# Patient Record
Sex: Male | Born: 1970 | Race: Black or African American | Hispanic: No | Marital: Married | Smoking: Never smoker
Health system: Southern US, Community
[De-identification: ages and names within clinical notes are randomized; demographics above are authoritative.]

## PROBLEM LIST (undated history)

## (undated) DIAGNOSIS — Q2112 Patent foramen ovale: Secondary | ICD-10-CM

## (undated) DIAGNOSIS — Q211 Atrial septal defect: Secondary | ICD-10-CM

## (undated) DIAGNOSIS — G43909 Migraine, unspecified, not intractable, without status migrainosus: Secondary | ICD-10-CM

## (undated) DIAGNOSIS — I480 Paroxysmal atrial fibrillation: Secondary | ICD-10-CM

## (undated) HISTORY — DX: Paroxysmal atrial fibrillation: I48.0

## (undated) HISTORY — DX: Atrial septal defect: Q21.1

## (undated) HISTORY — DX: Patent foramen ovale: Q21.12

---

## 2001-03-27 ENCOUNTER — Emergency Department (HOSPITAL_COMMUNITY): Admission: EM | Admit: 2001-03-27 | Discharge: 2001-03-27 | Payer: Self-pay | Admitting: Emergency Medicine

## 2001-03-27 ENCOUNTER — Encounter: Payer: Self-pay | Admitting: Emergency Medicine

## 2004-12-03 ENCOUNTER — Emergency Department (HOSPITAL_COMMUNITY): Admission: EM | Admit: 2004-12-03 | Discharge: 2004-12-03 | Payer: Self-pay | Admitting: Emergency Medicine

## 2005-08-28 ENCOUNTER — Ambulatory Visit: Payer: Self-pay | Admitting: Family Medicine

## 2005-09-15 ENCOUNTER — Ambulatory Visit: Payer: Self-pay | Admitting: Family Medicine

## 2006-06-28 DIAGNOSIS — G43009 Migraine without aura, not intractable, without status migrainosus: Secondary | ICD-10-CM | POA: Insufficient documentation

## 2006-06-28 DIAGNOSIS — I4949 Other premature depolarization: Secondary | ICD-10-CM

## 2011-07-04 ENCOUNTER — Ambulatory Visit (INDEPENDENT_AMBULATORY_CARE_PROVIDER_SITE_OTHER): Payer: Commercial Managed Care - PPO | Admitting: Family Medicine

## 2011-07-04 ENCOUNTER — Ambulatory Visit: Payer: Commercial Managed Care - PPO

## 2011-07-04 VITALS — BP 132/92 | HR 64 | Temp 97.9°F | Resp 16 | Ht 68.0 in | Wt 179.0 lb

## 2011-07-04 DIAGNOSIS — M25579 Pain in unspecified ankle and joints of unspecified foot: Secondary | ICD-10-CM

## 2011-07-04 DIAGNOSIS — S93429A Sprain of deltoid ligament of unspecified ankle, initial encounter: Secondary | ICD-10-CM

## 2011-07-04 NOTE — Progress Notes (Signed)
  Patient Name: Stephen Peterson Date of Birth: 1970-04-18 Medical Record Number: [id number] Gender: male Date of Encounter: 07/04/2011  History of Present Illness:  Stephen Peterson is a 41 y.o. very pleasant male patient who presents with the following:  Yesterday he was in a trash car pushing the trash down- he jumped out and landed on a flower bed edge and inverted his left ankle.  It hurt a lot right away.  He applied ice, ace wrap and used his wife's CAM boot and crutches.  He can now flex and extend the ankle, but cannot walk on it very well.   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  His job is fairly active and he is on his feet most of the time at work.    Patient Active Problem List  Diagnoses  . COMMON MIGRAINE  . PREMATURE VENTRICULAR CONTRACTIONS   No past medical history on file. No past surgical history on file. History  Substance Use Topics  . Smoking status: Never Smoker   . Smokeless tobacco: Not on file  . Alcohol Use: Not on file   No family history on file. No Known Allergies  Medication list has been reviewed and updated.  Review of Systems: As per HPI- otherwise negative. No history of prior ankle problems  Physical Examination: Filed Vitals:   07/04/11 1049  BP: 132/92  Pulse: 64  Temp: 97.9 F (36.6 C)  TempSrc: Oral  Resp: 16  Height: 5\' 8"  (1.727 m)  Weight: 179 lb (81.194 kg)    Body mass index is 27.22 kg/(m^2).   GEN: WDWN, NAD, Non-toxic, Alert & Oriented x 3 HEENT: Atraumatic, Normocephalic.  Ears and Nose: No external deformity. EXTR: No clubbing/cyanosis/edema NEURO: antalgic gait PSYCH: Normally interactive. Conversant. Not depressed or anxious appearing.  Calm demeanor.  Left foot and ankle:  No tenderness or swelling, achilles intact.  Patient feels discomfort with WB, not with exam  [facility] reading (PRIMARY) by  Dr. Patsy Lager. Negative foot and ankle   Assessment and Plan: 1. Pain, ankle  DG  Ankle Complete Right, DG Foot Complete Right  2. Sprain, ankle joint, medial     Ankle sprain- already a lot better.  Continue ace wrap and cam boot as needed.  Ice, elevate.  OOW for 2 days.  Patient (or parent if minor) instructed to return to clinic or call if not better in 2-3 day(s).

## 2013-10-29 ENCOUNTER — Encounter: Payer: Self-pay | Admitting: *Deleted

## 2019-11-29 ENCOUNTER — Other Ambulatory Visit: Payer: Self-pay

## 2019-11-29 ENCOUNTER — Emergency Department (HOSPITAL_BASED_OUTPATIENT_CLINIC_OR_DEPARTMENT_OTHER): Payer: PRIVATE HEALTH INSURANCE

## 2019-11-29 ENCOUNTER — Encounter (HOSPITAL_BASED_OUTPATIENT_CLINIC_OR_DEPARTMENT_OTHER): Payer: Self-pay | Admitting: Emergency Medicine

## 2019-11-29 ENCOUNTER — Emergency Department (HOSPITAL_BASED_OUTPATIENT_CLINIC_OR_DEPARTMENT_OTHER)
Admission: EM | Admit: 2019-11-29 | Discharge: 2019-11-29 | Disposition: A | Discharge status: Home / Self Care | Payer: PRIVATE HEALTH INSURANCE | Attending: Emergency Medicine | Admitting: Emergency Medicine

## 2019-11-29 DIAGNOSIS — Z7982 Long term (current) use of aspirin: Secondary | ICD-10-CM | POA: Insufficient documentation

## 2019-11-29 DIAGNOSIS — R112 Nausea with vomiting, unspecified: Secondary | ICD-10-CM | POA: Insufficient documentation

## 2019-11-29 DIAGNOSIS — R1031 Right lower quadrant pain: Secondary | ICD-10-CM | POA: Diagnosis not present

## 2019-11-29 DIAGNOSIS — M5459 Other low back pain: Secondary | ICD-10-CM | POA: Insufficient documentation

## 2019-11-29 HISTORY — DX: Migraine, unspecified, not intractable, without status migrainosus: G43.909

## 2019-11-29 LAB — URINALYSIS, MICROSCOPIC (REFLEX)

## 2019-11-29 LAB — COMPREHENSIVE METABOLIC PANEL
ALT: 20 U/L (ref 0–44)
AST: 17 U/L (ref 15–41)
Albumin: 4.3 g/dL (ref 3.5–5.0)
Alkaline Phosphatase: 53 U/L (ref 38–126)
Anion gap: 8 (ref 5–15)
BUN: 12 mg/dL (ref 6–20)
CO2: 27 mmol/L (ref 22–32)
Calcium: 9.5 mg/dL (ref 8.9–10.3)
Chloride: 102 mmol/L (ref 98–111)
Creatinine, Ser: 1.09 mg/dL (ref 0.61–1.24)
GFR, Estimated: >60 mL/min (ref >60)
Glucose, Bld: 95 mg/dL (ref 70–99)
Potassium: 4 mmol/L (ref 3.5–5.1)
Sodium: 137 mmol/L (ref 135–145)
Total Bilirubin: 0.9 mg/dL (ref 0.3–1.2)
Total Protein: 7.8 g/dL (ref 6.5–8.1)

## 2019-11-29 LAB — CBC
HCT: 45.3 % (ref 39.0–52.0)
Hemoglobin: 14.9 g/dL (ref 13.0–17.0)
MCH: 29 pg (ref 26.0–34.0)
MCHC: 32.9 g/dL (ref 30.0–36.0)
MCV: 88.3 fL (ref 80.0–100.0)
Platelets: 304 10*3/uL (ref 150–400)
RBC: 5.13 MIL/uL (ref 4.22–5.81)
RDW: 12.6 % (ref 11.5–15.5)
WBC: 6.2 10*3/uL (ref 4.0–10.5)
nRBC: 0 % (ref 0.0–0.2)

## 2019-11-29 LAB — URINALYSIS, ROUTINE W REFLEX MICROSCOPIC
Bilirubin Urine: NEGATIVE
Glucose, UA: NEGATIVE mg/dL
Ketones, ur: NEGATIVE mg/dL
Leukocytes,Ua: NEGATIVE
Nitrite: NEGATIVE
Protein, ur: NEGATIVE mg/dL
Specific Gravity, Urine: 1.025 (ref 1.005–1.030)
pH: 6 (ref 5.0–8.0)

## 2019-11-29 LAB — LIPASE, BLOOD: Lipase: 31 U/L (ref 11–51)

## 2019-11-29 MED ORDER — LACTATED RINGERS IV BOLUS
1000.0000 mL | Freq: Once | INTRAVENOUS | Status: AC
  Administered 2019-11-29: 1000 mL via INTRAVENOUS

## 2019-11-29 MED ORDER — ONDANSETRON 4 MG PO TBDP: 4.0000 mg | ORAL_TABLET | Freq: Three times a day (TID) | ORAL | 0 refills | Status: DC | PRN

## 2019-11-29 MED ORDER — SODIUM CHLORIDE 0.9 % IV BOLUS: 1000.0000 mL | Freq: Once | INTRAVENOUS | Status: DC

## 2019-11-29 MED ORDER — IOHEXOL 300 MG/ML  SOLN
100.0000 mL | Freq: Once | INTRAMUSCULAR | Status: AC | PRN
  Administered 2019-11-29: 100 mL via INTRAVENOUS

## 2019-11-29 MED ORDER — TAMSULOSIN HCL 0.4 MG PO CAPS: 0.4000 mg | ORAL_CAPSULE | Freq: Every day | ORAL | 0 refills | Status: AC

## 2019-11-29 NOTE — ED Notes (Signed)
Taken to CT.

## 2019-11-29 NOTE — Discharge Instructions (Signed)
   Hydration: Hydration is key to helping a kidney stone pass.  Have a goal of half a liter of water every hour or two. Antiinflammatory medications: Take 600 mg of ibuprofen every 6 hours or 440 mg (over the counter dose) to 500 mg (prescription dose) of naproxen every 12 hours for the next 3 days. After this time, these medications may be used as needed for pain. Take these medications with food to avoid upset stomach. Choose only one of these medications, do not take them together. Acetaminophen: Should you continue to have additional pain while taking the ibuprofen or naproxen, you may add in acetaminophen (generic for Tylenol) as needed. Your daily total maximum amount of acetaminophen from all sources should be limited to 4000mg /day for persons without liver problems, or 2000mg /day for those with liver problems. Tamsulosin: This medication is designed to help the stone pass.  Take this medication daily until stone passes. Nausea/vomiting: Use the ondansetron (generic for Zofran) for nausea or vomiting.  This medication may not prevent all vomiting or nausea, but can help facilitate better hydration. Things that can help with nausea/vomiting also include peppermint/menthol candies, vitamin B12, and ginger. Follow-up: Follow-up with the urologist as soon as possible on this matter. Return: Return to the ED for significantly increased pain, difficulty urinating, pain with urination, fever, uncontrolled vomiting, or any other major concerns.  For prescription assistance, may try using prescription discount sites or apps, such as goodrx.com or Good Rx smart phone app.

## 2019-11-29 NOTE — ED Triage Notes (Signed)
RLQ pain since yesterday with N/V 

## 2019-11-29 NOTE — ED Provider Notes (Signed)
[facility] EMERGENCY DEPARTMENT Provider Note   CSN: [id number] Arrival date & time: 11/29/19  1329     History Chief Complaint  Patient presents with  . Abdominal Pain    Stephen Peterson is a 49 y.o. male.  HPI      Stephen Peterson is a 49 y.o. male, with a history of A. fib, migraines, presenting to the ED with abdominal pain beginning around 1 AM this morning.  Pain in the right lower quadrant, described as a dull ache, initially more intense and radiating to the right flank and right lower back, waxing and waning.  Now only present in the right lower quadrant, 5/10.  Accompanied by initial nausea and vomiting, no current nausea.  Denies fever/chills, diarrhea, hematochezia/melena, dysuria, hematuria, groin pain, scrotal/testicular pain/swelling, dizziness, neurologic deficits, or any other complaints.  Past Medical History:  Diagnosis Date  . AF (paroxysmal atrial fibrillation) (HCC)   . Migraines   . Patent foramen ovale     Patient Active Problem List   Diagnosis Date Noted  . COMMON MIGRAINE 06/28/2006  . PREMATURE VENTRICULAR CONTRACTIONS 06/28/2006    History reviewed. No pertinent surgical history.     No family history on file.  Social History   Tobacco Use  . Smoking status: Never Smoker  . Smokeless tobacco: Never Used  Substance Use Topics  . Alcohol use: Yes  . Drug use: Never    Home Medications Prior to Admission medications   Medication Sig Start Date End Date Taking? Authorizing Provider  aspirin 81 MG tablet Take 81 mg by mouth daily.    [provider]  ondansetron (ZOFRAN ODT) 4 MG disintegrating tablet Take 1 tablet (4 mg total) by mouth every 8 (eight) hours as needed for nausea or vomiting. 11/29/19   Dylana Shaw C, PA-C  tamsulosin (FLOMAX) 0.4 MG CAPS capsule Take 1 capsule (0.4 mg total) by mouth daily. 11/29/19   Dwight Adamczak C, PA-C    Allergies    Patient has no known allergies.  Review of Systems   Review of  Systems  Constitutional: Negative for chills, diaphoresis and fever.  Respiratory: Negative for cough and shortness of breath.   Cardiovascular: Negative for chest pain.  Gastrointestinal: Positive for abdominal pain, nausea (resolved) and vomiting (resolved). Negative for blood in stool and diarrhea.  Genitourinary: Negative for dysuria, frequency, hematuria, scrotal swelling and testicular pain.  Musculoskeletal: Negative for back pain (None currently).  Neurological: Negative for weakness and numbness.  All other systems reviewed and are negative.   Physical Exam Updated Vital Signs BP (!) 157/95 (BP Location: Right Arm)   Pulse 65   Temp 99 F (37.2 C) (Oral)   Resp 16   Ht 5\' 8"  (1.727 m)   Wt 89.4 kg   SpO2 100%   BMI 29.95 kg/m   Physical Exam Vitals and nursing note reviewed.  Constitutional:      General: He is not in acute distress.    Appearance: He is well-developed. He is not diaphoretic.  HENT:     Head: Normocephalic and atraumatic.     Mouth/Throat:     Mouth: Mucous membranes are moist.     Pharynx: Oropharynx is clear.  Eyes:     Conjunctiva/sclera: Conjunctivae normal.  Cardiovascular:     Rate and Rhythm: Normal rate and regular rhythm.     Pulses: Normal pulses.          Radial pulses are 2+ on the right side and 2+  on the left side.       Posterior tibial pulses are 2+ on the right side and 2+ on the left side.     Heart sounds: Normal heart sounds.     Comments: Tactile temperature in the extremities appropriate and equal bilaterally. Pulmonary:     Effort: Pulmonary effort is normal. No respiratory distress.     Breath sounds: Normal breath sounds.  Abdominal:     Palpations: Abdomen is soft.     Tenderness: There is abdominal tenderness in the right lower quadrant. There is no right CVA tenderness, left CVA tenderness or guarding.     Comments: Seemingly mild tenderness in the right lower quadrant.  Musculoskeletal:     Cervical back: Neck  supple.     Right lower leg: No edema.     Left lower leg: No edema.  Lymphadenopathy:     Cervical: No cervical adenopathy.  Skin:    General: Skin is warm and dry.  Neurological:     Mental Status: He is alert.     Comments: Normal strength in the lower extremities bilaterally.  Psychiatric:        Mood and Affect: Mood and affect normal.        Speech: Speech normal.        Behavior: Behavior normal.     ED Results / Procedures / Treatments   Labs (all labs ordered are listed, but only abnormal results are displayed) Labs Reviewed  URINALYSIS, ROUTINE W REFLEX MICROSCOPIC - Abnormal; Notable for the following components:      Result Value   Hgb urine dipstick MODERATE (*)    All other components within normal limits  URINALYSIS, MICROSCOPIC (REFLEX) - Abnormal; Notable for the following components:   Bacteria, UA MANY (*)    All other components within normal limits  URINE CULTURE  LIPASE, BLOOD  COMPREHENSIVE METABOLIC PANEL  CBC    EKG None  Radiology CT ABDOMEN PELVIS W CONTRAST  Result Date: 11/29/2019 CLINICAL DATA:  RIGHT lower quadrant pain EXAM: CT ABDOMEN AND PELVIS WITH CONTRAST TECHNIQUE: Multidetector CT imaging of the abdomen and pelvis was performed using the standard protocol following bolus administration of intravenous contrast. CONTRAST:  OMNIPAQUE IOHEXOL 300 MG/ML  SOLN COMPARISON:  None. FINDINGS: Lower chest: No acute abnormality. Hepatobiliary: Focal fatty deposition adjacent to the falciform ligament. Gallbladder has likely multiple layering gallstones. Subcentimeter hypodense lesion of the RIGHT liver is too small to accurately characterize but likely reflects a cyst. Pancreas: Unremarkable. No pancreatic ductal dilatation or surrounding inflammatory changes. Spleen: Normal in size without focal abnormality. Adrenals/Urinary Tract: Adrenal glands are unremarkable. No hydronephrosis. There is an 8 mm nephrolithiasis sitting in the RIGHT renal  pelvis. Excreted contrast is seen coursing past this nephrolithiasis on delayed images. Multiple renal cysts. Subcentimeter hypodense lesions are too small to accurately characterize. Bladder is decompressed. Stomach/Bowel: Stomach is within normal limits. Appendix appears normal. No evidence of bowel wall thickening, distention, or inflammatory changes. Moderate colonic stool burden. Tiny hiatal hernia. Vascular/Lymphatic: Aortic atherosclerosis, mild. No suspicious lymphadenopathy. Reproductive: Prostate is unremarkable. Other: No abdominal wall hernia or abnormality. No abdominopelvic ascites. Musculoskeletal: No acute or significant osseous findings. Likely bone island of the RIGHT superior pubic ramus. IMPRESSION: 1. There is a 8 mm nephrolithiasis sitting in the RIGHT renal pelvis. No hydronephrosis or obstructive physiology visualized. 2. Moderate colonic stool burden. 3. Normal appendix. Aortic Atherosclerosis (ICD10-I70.0). Electronically Signed   By: Meda Klinefelter MD   On: 11/29/2019  16:01    Procedures Procedures (including critical care time)  Medications Ordered in ED Medications  lactated ringers bolus 1,000 mL (1,000 mLs Intravenous New Bag/Given 11/29/19 1524)  iohexol (OMNIPAQUE) 300 MG/ML solution 100 mL (100 mLs Intravenous Contrast Given 11/29/19 1531)    ED Course  I have reviewed the triage vital signs and the nursing notes.  Pertinent labs & imaging results that were available during my care of the patient were reviewed by me and considered in my medical decision making (see chart for details).  Clinical Course as of Nov 29 1627  Sat Nov 29, 2019  1514 Declines analgesia currently.   [SJ]  1627 Patient continues to voice improvement in pain, now rates it 3/10.   [SJ]    Clinical Course User Index [SJ] Miran Kautzman, Hillard Danker, PA-C   MDM Rules/Calculators/A&P                          Patient presents with pain to the right lower quadrant along with nausea and vomiting.  Patient is nontoxic appearing, afebrile, not tachycardic, not tachypneic, not hypotensive, maintains excellent SPO2 on room air, and is in no apparent distress.   I have reviewed the patient's chart to obtain more information.   I reviewed and interpreted the patient's labs and radiological studies. No leukocytosis.  Lab work overall reassuring.  Microscopic hematuria and some bacteria.  No urinary symptoms.  My suspicion for active urinary infection is low.  No ureteral stone noted on CT. I do have suspicion for recently passed stone, however.  Also discussed with the patient his incidental findings on his CT.  The patient was given instructions for home care as well as return precautions. Patient voices understanding of these instructions, accepts the plan, and is comfortable with discharge.    Final Clinical Impression(s) / ED Diagnoses Final diagnoses:  Right lower quadrant abdominal pain    Rx / DC Orders ED Discharge Orders         Ordered    ondansetron (ZOFRAN ODT) 4 MG disintegrating tablet  Every 8 hours PRN        11/29/19 1628    tamsulosin (FLOMAX) 0.4 MG CAPS capsule  Daily        11/29/19 1629           Anselm Pancoast, PA-C 11/29/19 1631    Milagros Loll, MD 11/29/19 1650

## 2019-11-29 NOTE — ED Notes (Signed)
Joss, RN spoke with Okey Regal in lab to add on urine culture

## 2019-12-01 LAB — URINE CULTURE

## 2019-12-02 ENCOUNTER — Emergency Department (HOSPITAL_BASED_OUTPATIENT_CLINIC_OR_DEPARTMENT_OTHER)
Admission: EM | Admit: 2019-12-02 | Discharge: 2019-12-02 | Disposition: A | Discharge status: Home / Self Care | Payer: PRIVATE HEALTH INSURANCE | Attending: Emergency Medicine | Admitting: Emergency Medicine

## 2019-12-02 ENCOUNTER — Emergency Department (HOSPITAL_BASED_OUTPATIENT_CLINIC_OR_DEPARTMENT_OTHER): Payer: PRIVATE HEALTH INSURANCE

## 2019-12-02 ENCOUNTER — Encounter (HOSPITAL_BASED_OUTPATIENT_CLINIC_OR_DEPARTMENT_OTHER): Payer: Self-pay | Admitting: Emergency Medicine

## 2019-12-02 ENCOUNTER — Other Ambulatory Visit: Payer: Self-pay

## 2019-12-02 DIAGNOSIS — K5900 Constipation, unspecified: Secondary | ICD-10-CM | POA: Diagnosis not present

## 2019-12-02 DIAGNOSIS — R109 Unspecified abdominal pain: Secondary | ICD-10-CM | POA: Diagnosis present

## 2019-12-02 DIAGNOSIS — N132 Hydronephrosis with renal and ureteral calculous obstruction: Secondary | ICD-10-CM | POA: Diagnosis not present

## 2019-12-02 DIAGNOSIS — N200 Calculus of kidney: Secondary | ICD-10-CM

## 2019-12-02 LAB — CBC WITH DIFFERENTIAL/PLATELET
Abs Immature Granulocytes: 0.03 10*3/uL (ref 0.00–0.07)
Basophils Absolute: 0 10*3/uL (ref 0.0–0.1)
Basophils Relative: 1 %
Eosinophils Absolute: 0.2 10*3/uL (ref 0.0–0.5)
Eosinophils Relative: 4 %
HCT: 42.9 % (ref 39.0–52.0)
Hemoglobin: 14.3 g/dL (ref 13.0–17.0)
Immature Granulocytes: 1 %
Lymphocytes Relative: 32 %
Lymphs Abs: 1.8 10*3/uL (ref 0.7–4.0)
MCH: 29.2 pg (ref 26.0–34.0)
MCHC: 33.3 g/dL (ref 30.0–36.0)
MCV: 87.6 fL (ref 80.0–100.0)
Monocytes Absolute: 0.5 10*3/uL (ref 0.1–1.0)
Monocytes Relative: 9 %
Neutro Abs: 3.1 10*3/uL (ref 1.7–7.7)
Neutrophils Relative %: 53 %
Platelets: 272 10*3/uL (ref 150–400)
RBC: 4.9 MIL/uL (ref 4.22–5.81)
RDW: 12.4 % (ref 11.5–15.5)
WBC: 5.8 10*3/uL (ref 4.0–10.5)
nRBC: 0 % (ref 0.0–0.2)

## 2019-12-02 LAB — BASIC METABOLIC PANEL
Anion gap: 12 (ref 5–15)
BUN: 15 mg/dL (ref 6–20)
CO2: 27 mmol/L (ref 22–32)
Calcium: 9.8 mg/dL (ref 8.9–10.3)
Chloride: 101 mmol/L (ref 98–111)
Creatinine, Ser: 1.38 mg/dL — ABNORMAL HIGH (ref 0.61–1.24)
GFR, Estimated: 59 mL/min — ABNORMAL LOW (ref >60)
Glucose, Bld: 132 mg/dL — ABNORMAL HIGH (ref 70–99)
Potassium: 4.1 mmol/L (ref 3.5–5.1)
Sodium: 140 mmol/L (ref 135–145)

## 2019-12-02 LAB — URINALYSIS, ROUTINE W REFLEX MICROSCOPIC
Bilirubin Urine: NEGATIVE
Glucose, UA: NEGATIVE mg/dL
Ketones, ur: NEGATIVE mg/dL
Leukocytes,Ua: NEGATIVE
Nitrite: NEGATIVE
Protein, ur: NEGATIVE mg/dL
Specific Gravity, Urine: 1.01 (ref 1.005–1.030)
pH: 6 (ref 5.0–8.0)

## 2019-12-02 LAB — URINALYSIS, MICROSCOPIC (REFLEX)

## 2019-12-02 MED ORDER — FENTANYL CITRATE (PF) 100 MCG/2ML IJ SOLN: 50.0000 ug | Freq: Once | INTRAMUSCULAR | Status: DC

## 2019-12-02 MED ORDER — ONDANSETRON 4 MG PO TBDP: 4.0000 mg | ORAL_TABLET | Freq: Three times a day (TID) | ORAL | 0 refills | Status: DC | PRN

## 2019-12-02 MED ORDER — MORPHINE SULFATE (PF) 4 MG/ML IV SOLN
4.0000 mg | Freq: Once | INTRAVENOUS | Status: DC
  Filled 2019-12-02: qty 1

## 2019-12-02 MED ORDER — SODIUM CHLORIDE 0.9 % IV SOLN: INTRAVENOUS | Status: DC

## 2019-12-02 MED ORDER — ONDANSETRON 8 MG PO TBDP: ORAL_TABLET | ORAL | 0 refills | Status: AC

## 2019-12-02 MED ORDER — DICLOFENAC SODIUM ER 100 MG PO TB24: 100.0000 mg | ORAL_TABLET | Freq: Every day | ORAL | 0 refills | Status: AC

## 2019-12-02 MED ORDER — KETOROLAC TROMETHAMINE 30 MG/ML IJ SOLN
30.0000 mg | Freq: Once | INTRAMUSCULAR | Status: AC
  Administered 2019-12-02: 30 mg via INTRAVENOUS
  Filled 2019-12-02: qty 1

## 2019-12-02 MED ORDER — SODIUM CHLORIDE 0.9 % IV BOLUS
500.0000 mL | Freq: Once | INTRAVENOUS | Status: AC
  Administered 2019-12-02: 500 mL via INTRAVENOUS

## 2019-12-02 MED ORDER — ONDANSETRON HCL 4 MG/2ML IJ SOLN
4.0000 mg | Freq: Once | INTRAMUSCULAR | Status: AC
  Administered 2019-12-02: 4 mg via INTRAVENOUS
  Filled 2019-12-02: qty 2

## 2019-12-02 MED ORDER — OXYCODONE-ACETAMINOPHEN 5-325 MG PO TABS: 1.0000 | ORAL_TABLET | ORAL | 0 refills | Status: AC | PRN

## 2019-12-02 NOTE — ED Notes (Signed)
Pt denies pain at this time, no morphine given.

## 2019-12-02 NOTE — ED Notes (Signed)
Review D/C papers with pt, reviewed Rx with pt, pt states understanding, pt denies questions at this time. 

## 2019-12-02 NOTE — ED Triage Notes (Signed)
Pt states he was seen here on Saturday and was diagnosed with a kidney stone  Pt states he is having right flank pain tonight that started around 1am  Pt states he has taken his pain medication at home without relief

## 2019-12-02 NOTE — ED Notes (Signed)
Pt awaiting CT CD for urology.

## 2019-12-02 NOTE — ED Notes (Signed)
Pt drinking ginger ale without difficulty  

## 2019-12-02 NOTE — ED Notes (Signed)
Patient transported to CT 

## 2019-12-02 NOTE — ED Provider Notes (Signed)
Patient care assumed at 0700. Patient here for evaluation of right flank pain. CT and reexam pending at time of transfer. CT scan demonstrates large proximal ureteral stone. On assessment patient's pain is well-controlled and he is tolerating oral's without difficulty. Discussed with patient findings of studies and importance of neurology follow-up. Return precautions discussed.   Tilden Fossa, MD 12/02/19 [phone]

## 2019-12-02 NOTE — ED Provider Notes (Addendum)
[facility] EMERGENCY DEPARTMENT Provider Note   CSN: [id number] Arrival date & time: 12/02/19  0545     History Chief Complaint  Patient presents with  . Flank Pain    Stephen Peterson is a 49 y.o. male.   Flank Pain This is a recurrent problem. The current episode started 3 to 5 hours ago. The problem occurs constantly. The problem has not changed since onset.Pertinent negatives include no chest pain, no abdominal pain, no headaches and no shortness of breath. Nothing aggravates the symptoms. He has tried nothing for the symptoms. The treatment provided no relief.  Patient seen 2 days ago here and diagnosed with kidney stone and then seen this evening for gerd at [facility].  No n/v/d.  Pain is in the R lower abdomen at this time.      Past Medical History:  Diagnosis Date  . AF (paroxysmal atrial fibrillation) (HCC)   . Migraines   . Patent foramen ovale     Patient Active Problem List   Diagnosis Date Noted  . COMMON MIGRAINE 06/28/2006  . PREMATURE VENTRICULAR CONTRACTIONS 06/28/2006    History reviewed. No pertinent surgical history.     Family History  Problem Relation Age of Onset  . Glaucoma Mother   . CAD Father   . Stroke Other     Social History   Tobacco Use  . Smoking status: Never Smoker  . Smokeless tobacco: Never Used  Vaping Use  . Vaping Use: Never used  Substance Use Topics  . Alcohol use: Yes  . Drug use: Never    Home Medications Prior to Admission medications   Medication Sig Start Date End Date Taking? Authorizing Provider  aspirin 81 MG tablet Take 81 mg by mouth daily.    [provider]  Diclofenac Sodium CR 100 MG 24 hr tablet Take 1 tablet (100 mg total) by mouth daily. 12/02/19   Annalysa Mohammad, MD  ondansetron (ZOFRAN ODT) 4 MG disintegrating tablet Take 1 tablet (4 mg total) by mouth every 8 (eight) hours as needed for nausea or vomiting. 12/02/19   Bladen Umar, MD  ondansetron (ZOFRAN ODT) 8 MG  disintegrating tablet 8mg  ODT q4 hours prn nausea 12/02/19   Loyce Flaming, MD  tamsulosin (FLOMAX) 0.4 MG CAPS capsule Take 1 capsule (0.4 mg total) by mouth daily. 11/29/19   Joy, Shawn C, PA-C    Allergies    Patient has no known allergies.  Review of Systems   Review of Systems  Constitutional: Negative for fever.  HENT: Negative for congestion.   Eyes: Negative for visual disturbance.  Respiratory: Negative for shortness of breath.   Cardiovascular: Negative for chest pain.  Gastrointestinal: Negative for abdominal pain and vomiting.  Genitourinary: Positive for flank pain.  Musculoskeletal: Negative for arthralgias.  Skin: Negative for rash.  Neurological: Negative for headaches.  Psychiatric/Behavioral: Negative for agitation.  All other systems reviewed and are negative.   Physical Exam Updated Vital Signs BP 140/89 (BP Location: Left Arm)   Pulse 65   Temp 98.1 F (36.7 C) (Oral)   Resp 18   Ht 5\' 8"  (1.727 m)   Wt 90.7 kg   SpO2 99%   BMI 30.41 kg/m   Physical Exam Vitals and nursing note reviewed.  Constitutional:      General: He is not in acute distress.    Appearance: Normal appearance.  HENT:     Head: Normocephalic and atraumatic.     Nose: Nose normal.  Eyes:  Conjunctiva/sclera: Conjunctivae normal.     Pupils: Pupils are equal, round, and reactive to light.  Cardiovascular:     Rate and Rhythm: Normal rate and regular rhythm.     Pulses: Normal pulses.     Heart sounds: Normal heart sounds.  Pulmonary:     Effort: Pulmonary effort is normal.     Breath sounds: Normal breath sounds.  Abdominal:     General: Abdomen is flat. Bowel sounds are normal.     Palpations: Abdomen is soft.     Tenderness: There is no abdominal tenderness. There is no guarding or rebound.  Musculoskeletal:        General: Normal range of motion.     Cervical back: Normal range of motion and neck supple.  Skin:    General: Skin is warm and dry.     Capillary  Refill: Capillary refill takes less than 2 seconds.  Neurological:     General: No focal deficit present.     Mental Status: He is alert and oriented to person, place, and time.     Deep Tendon Reflexes: Reflexes normal.  Psychiatric:        Mood and Affect: Mood normal.        Behavior: Behavior normal.     ED Results / Procedures / Treatments   Labs (all labs ordered are listed, but only abnormal results are displayed) Results for orders placed or performed during the hospital encounter of 12/02/19  Basic metabolic panel  Result Value Ref Range   Sodium 140 135 - 145 mmol/L   Potassium 4.1 3.5 - 5.1 mmol/L   Chloride 101 98 - 111 mmol/L   CO2 27 22 - 32 mmol/L   Glucose, Bld 132 (H) 70 - 99 mg/dL   BUN 15 6 - 20 mg/dL   Creatinine, Ser 9.35 (H) 0.61 - 1.24 mg/dL   Calcium 9.8 8.9 - 70.1 mg/dL   GFR, Estimated 59 (L) >60 mL/min   Anion gap 12 5 - 15  Urinalysis, Routine w reflex microscopic Urine, Clean Catch  Result Value Ref Range   Color, Urine YELLOW YELLOW   APPearance CLEAR CLEAR   Specific Gravity, Urine 1.010 1.005 - 1.030   pH 6.0 5.0 - 8.0   Glucose, UA NEGATIVE NEGATIVE mg/dL   Hgb urine dipstick LARGE (A) NEGATIVE   Bilirubin Urine NEGATIVE NEGATIVE   Ketones, ur NEGATIVE NEGATIVE mg/dL   Protein, ur NEGATIVE NEGATIVE mg/dL   Nitrite NEGATIVE NEGATIVE   Leukocytes,Ua NEGATIVE NEGATIVE  Urinalysis, Microscopic (reflex)  Result Value Ref Range   RBC / HPF 6-10 0 - 5 RBC/hpf   WBC, UA 0-5 0 - 5 WBC/hpf   Bacteria, UA RARE (A) NONE SEEN   Squamous Epithelial / LPF 0-5 0 - 5   CT ABDOMEN PELVIS W CONTRAST  Result Date: 11/29/2019 CLINICAL DATA:  RIGHT lower quadrant pain EXAM: CT ABDOMEN AND PELVIS WITH CONTRAST TECHNIQUE: Multidetector CT imaging of the abdomen and pelvis was performed using the standard protocol following bolus administration of intravenous contrast. CONTRAST:  OMNIPAQUE IOHEXOL 300 MG/ML  SOLN COMPARISON:  None. FINDINGS: Lower chest:  No acute abnormality. Hepatobiliary: Focal fatty deposition adjacent to the falciform ligament. Gallbladder has likely multiple layering gallstones. Subcentimeter hypodense lesion of the RIGHT liver is too small to accurately characterize but likely reflects a cyst. Pancreas: Unremarkable. No pancreatic ductal dilatation or surrounding inflammatory changes. Spleen: Normal in size without focal abnormality. Adrenals/Urinary Tract: Adrenal glands are unremarkable. No hydronephrosis.  There is an 8 mm nephrolithiasis sitting in the RIGHT renal pelvis. Excreted contrast is seen coursing past this nephrolithiasis on delayed images. Multiple renal cysts. Subcentimeter hypodense lesions are too small to accurately characterize. Bladder is decompressed. Stomach/Bowel: Stomach is within normal limits. Appendix appears normal. No evidence of bowel wall thickening, distention, or inflammatory changes. Moderate colonic stool burden. Tiny hiatal hernia. Vascular/Lymphatic: Aortic atherosclerosis, mild. No suspicious lymphadenopathy. Reproductive: Prostate is unremarkable. Other: No abdominal wall hernia or abnormality. No abdominopelvic ascites. Musculoskeletal: No acute or significant osseous findings. Likely bone island of the RIGHT superior pubic ramus. IMPRESSION: 1. There is a 8 mm nephrolithiasis sitting in the RIGHT renal pelvis. No hydronephrosis or obstructive physiology visualized. 2. Moderate colonic stool burden. 3. Normal appendix. Aortic Atherosclerosis (ICD10-I70.0). Electronically Signed   By: Meda Klinefelter MD   On: 11/29/2019 16:01    Radiology No results found.  Procedures Procedures (including critical care time)  Medications Ordered in ED Medications  morphine 4 MG/ML injection 4 mg (has no administration in time range)  0.9 %  sodium chloride infusion (has no administration in time range)  ketorolac (TORADOL) 30 MG/ML injection 30 mg (30 mg Intravenous Given 12/02/19 0619)  sodium chloride  0.9 % bolus 500 mL (500 mLs Intravenous New Bag/Given 12/02/19 0618)  ondansetron (ZOFRAN) injection 4 mg (4 mg Intravenous Given 12/02/19 7939)    ED Course  I have reviewed the triage vital signs and the nursing notes.  Pertinent labs & imaging results that were available during my care of the patient were reviewed by me and considered in my medical decision making (see chart for details).    642 Case d/w Dr. Arita Miss, improve pain and have patient call today to be seen this week, may be able to be seen today or tomorrow.  Toradol, flomax and IV narcotics pain medication   Normal CBC at OSH, kidney function only marginally changed since 10/9 but given IVF.   Final Clinical Impression(s) / ED Diagnoses   Signed out to Dr. Madilyn Hook pending pain management        Thorne Wirz, MD 12/02/19 [phone]

## 2019-12-02 NOTE — Discharge Instructions (Addendum)
Continue Flomax.  Call urology today to be seen ASAP.  Start taking Miralax one capful daily   you have a 6 x 10 x 7 mm stone in your right ureter.

## 2019-12-03 LAB — URINE CULTURE: Culture: NO GROWTH

## 2021-09-12 IMAGING — CT CT ABD-PELV W/ CM
2 of 5 series · 16 of 46 positions shown, 18 images · IV contrast (Omnipaque)
Comparison: None.

CLINICAL DATA: RIGHT lower quadrant pain

EXAM:
CT ABDOMEN AND PELVIS WITH CONTRAST
TECHNIQUE: Multidetector CT imaging of the abdomen and pelvis was performed
using the standard protocol following bolus administration of
intravenous contrast.
CONTRAST:  100mL OMNIPAQUE IOHEXOL 300 MG/ML  SOLN

[Series 2: axial st · axial · 0.82mm/px · z∈[-486,-102]mm · 13 of 87 slices shown, 15 images]
[im 5/87  soft-tissue]
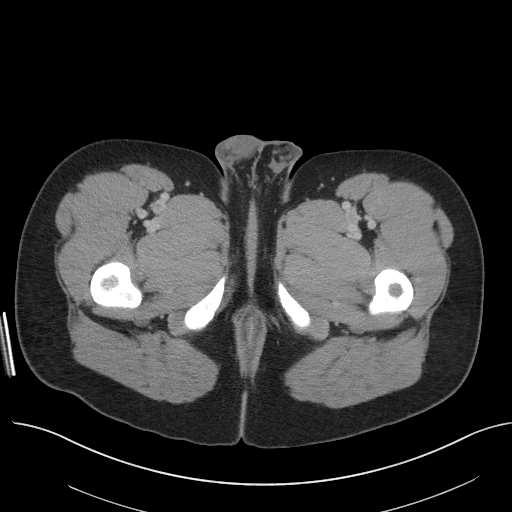
[im 5/87  bone]
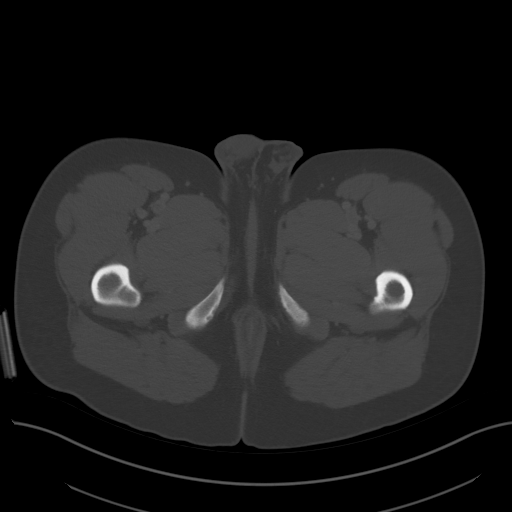
[im 14/87  soft-tissue]
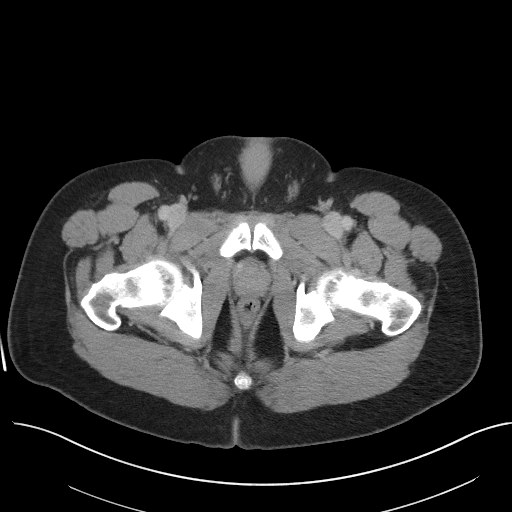
[im 19/87  soft-tissue]
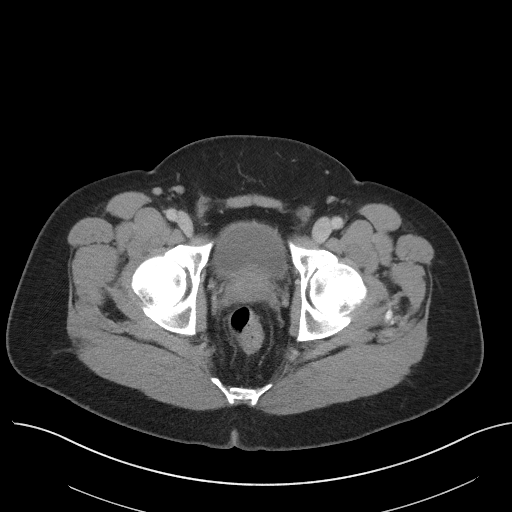
[im 23/87  soft-tissue]
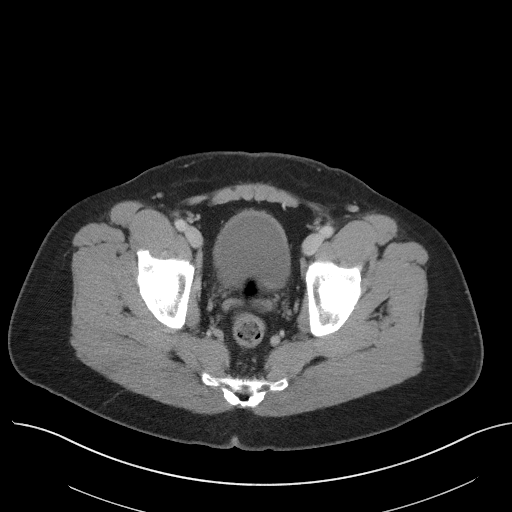
[im 32/87  soft-tissue]
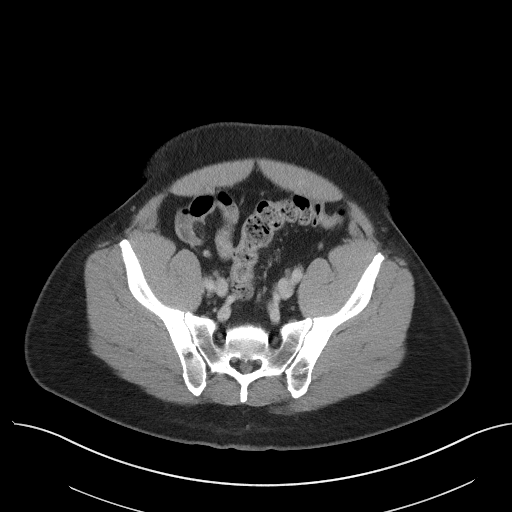
[im 37/87  soft-tissue]
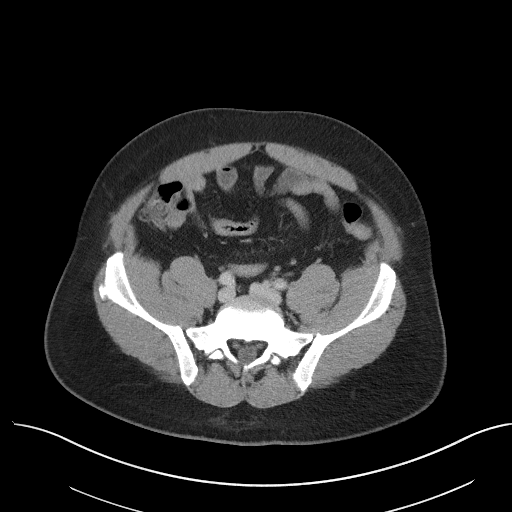
[im 46/87  soft-tissue]
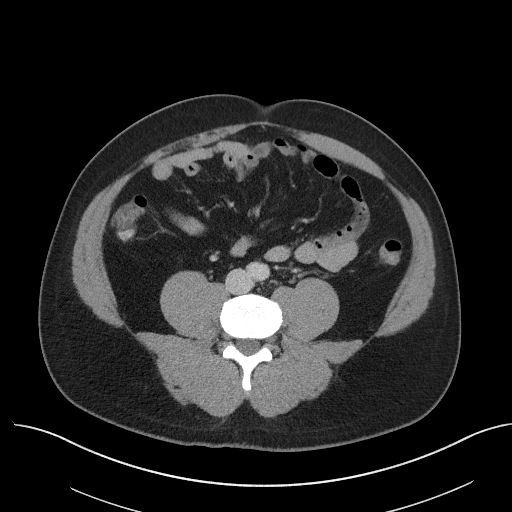
[im 50/87  soft-tissue]
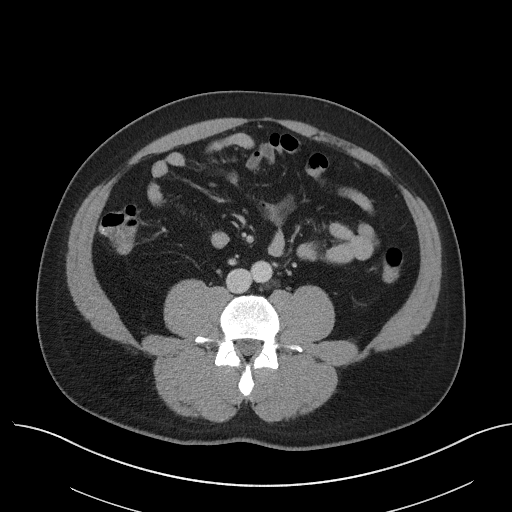
[im 55/87  soft-tissue]
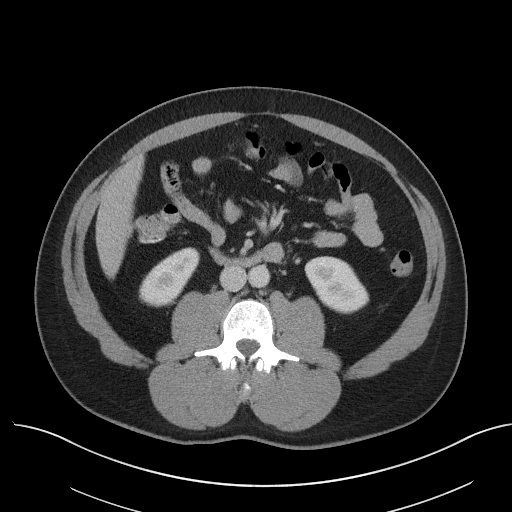
[im 55/87  bone]
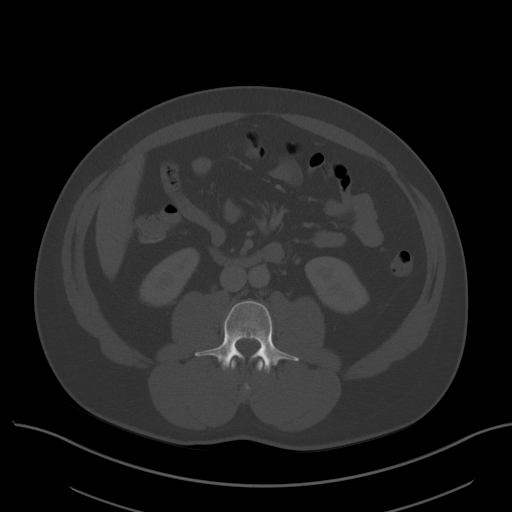
[im 64/87  soft-tissue]
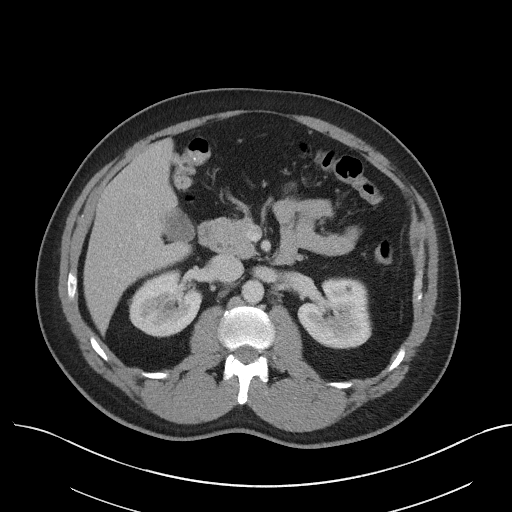
[im 68/87  soft-tissue]
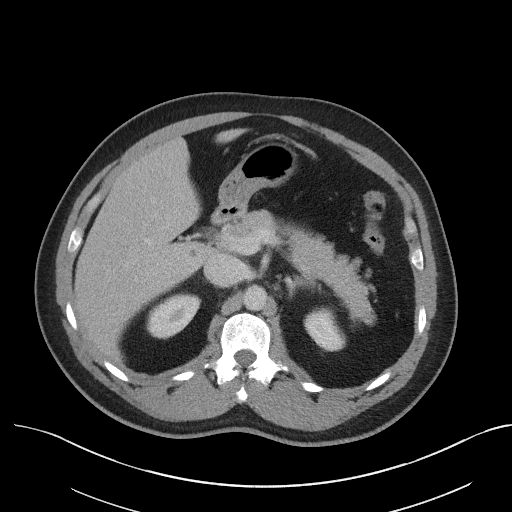
[im 73/87  soft-tissue]
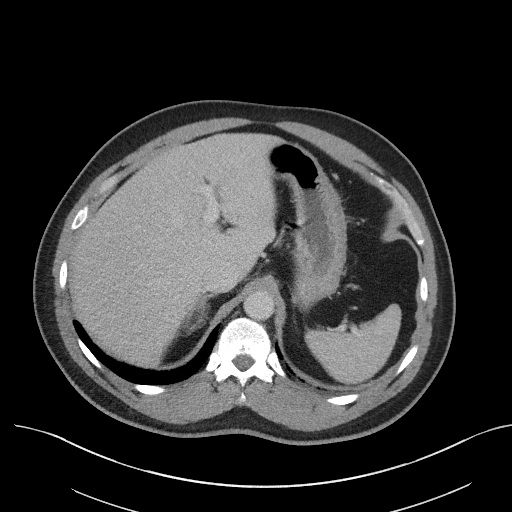
[im 82/87  soft-tissue]
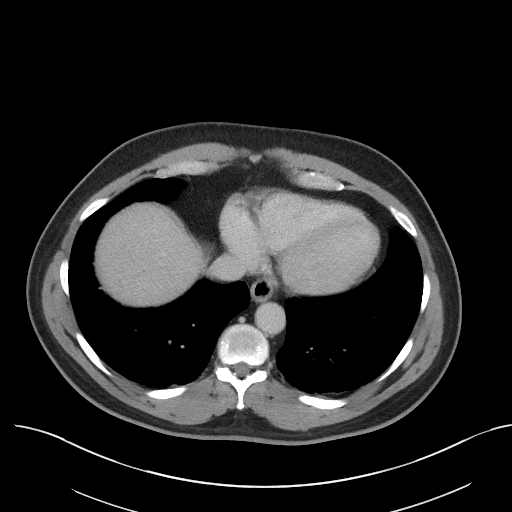

[Series 5: coronal st · coronal · 0.83mm/px · 3 of 107 slices shown]
[im 36/107  soft-tissue]
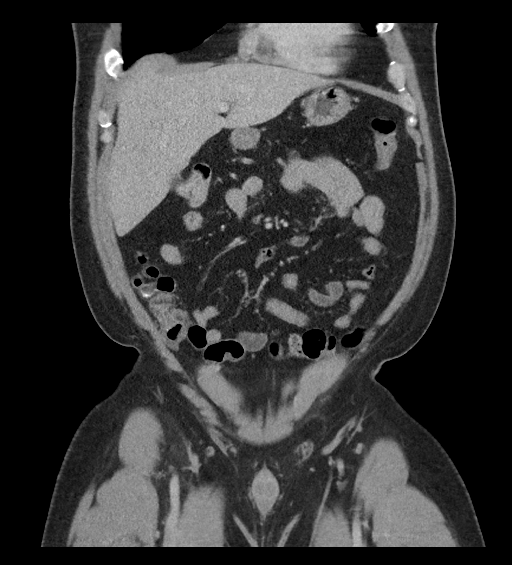
[im 48/107  soft-tissue]
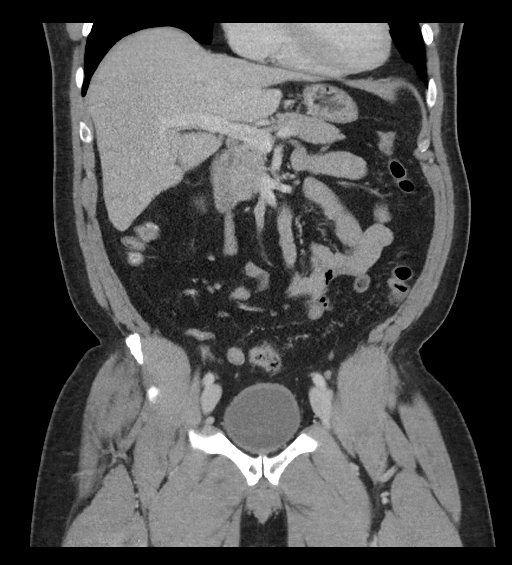
[im 59/107  soft-tissue]
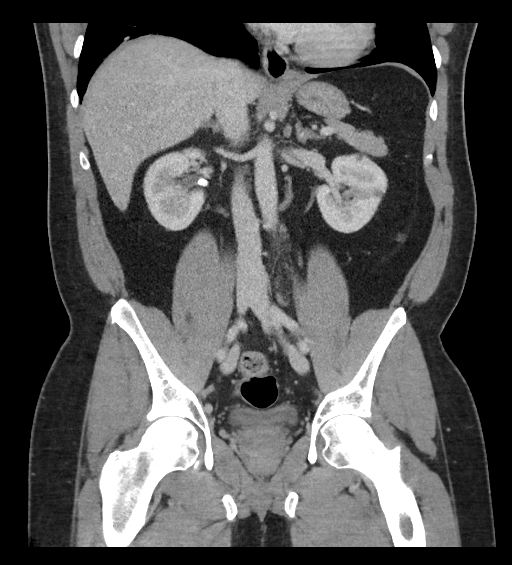

[16 of 46 positions shown; findings below may reference images not displayed]

FINDINGS: Lower chest: No acute abnormality.

Hepatobiliary: Focal fatty deposition adjacent to the falciform
ligament. Gallbladder has likely multiple layering gallstones.
Subcentimeter hypodense lesion of the RIGHT liver is too small to
accurately characterize but likely reflects a cyst.

Pancreas: Unremarkable. No pancreatic ductal dilatation or
surrounding inflammatory changes.

Spleen: Normal in size without focal abnormality.

Adrenals/Urinary Tract: Adrenal glands are unremarkable. No
hydronephrosis. There is an 8 mm nephrolithiasis sitting in the
RIGHT renal pelvis. Excreted contrast is seen coursing past this
nephrolithiasis on delayed images. Multiple renal cysts.
Subcentimeter hypodense lesions are too small to accurately
characterize. Bladder is decompressed.

Stomach/Bowel: Stomach is within normal limits. Appendix appears
normal. No evidence of bowel wall thickening, distention, or
inflammatory changes. Moderate colonic stool burden. Tiny hiatal
hernia.

Vascular/Lymphatic: Aortic atherosclerosis, mild. No suspicious
lymphadenopathy.

Reproductive: Prostate is unremarkable.

Other: No abdominal wall hernia or abnormality. No abdominopelvic
ascites.

Musculoskeletal: No acute or significant osseous findings. Likely
bone island of the RIGHT superior pubic ramus.
IMPRESSION: 1. There is a 8 mm nephrolithiasis sitting in the RIGHT renal
pelvis. No hydronephrosis or obstructive physiology visualized.
2. Moderate colonic stool burden.
3. Normal appendix.

Aortic Atherosclerosis ([id number]).

## 2021-09-15 IMAGING — CT CT RENAL STONE PROTOCOL
2 of 4 series · 16 of 46 positions shown, 18 images · non-contrast
Comparison: 11/29/2019

CLINICAL DATA: Flank pain.  Kidney stones suspected.

EXAM:
CT ABDOMEN AND PELVIS WITHOUT CONTRAST
TECHNIQUE: Multidetector CT imaging of the abdomen and pelvis was performed
following the standard protocol without IV contrast.

[Series 2: axial st · axial · 0.79mm/px · z∈[+691,+1086]mm · 13 of 87 slices shown, 15 images]
[im 4/87  soft-tissue]
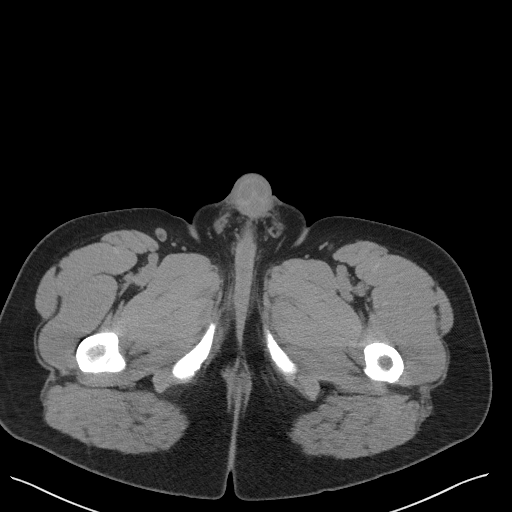
[im 4/87  bone]
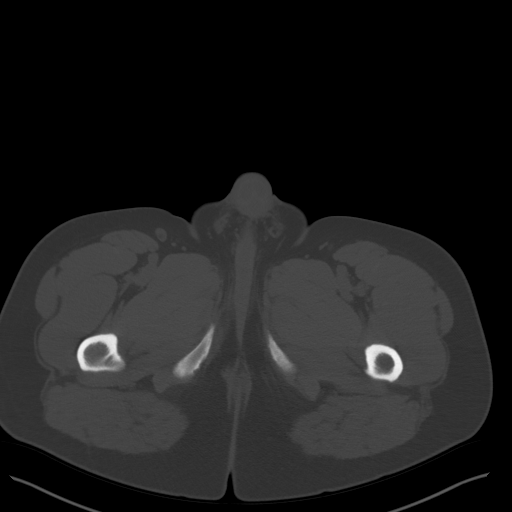
[im 12/87  soft-tissue]
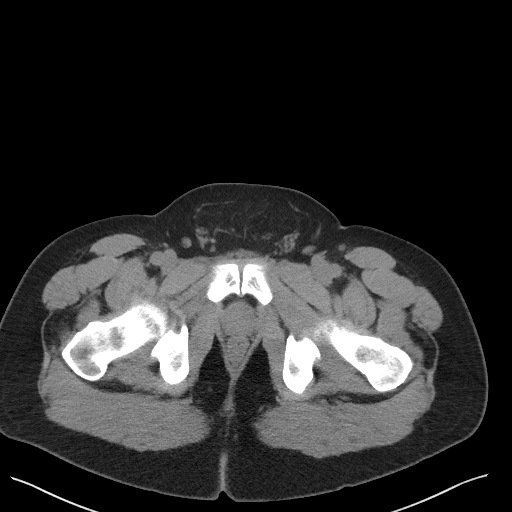
[im 20/87  soft-tissue]
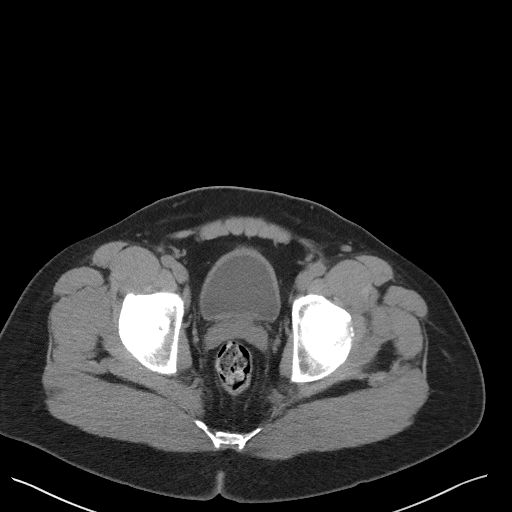
[im 24/87  soft-tissue]
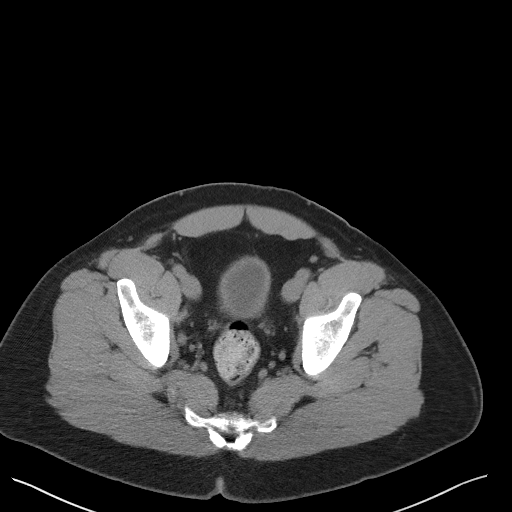
[im 32/87  soft-tissue]
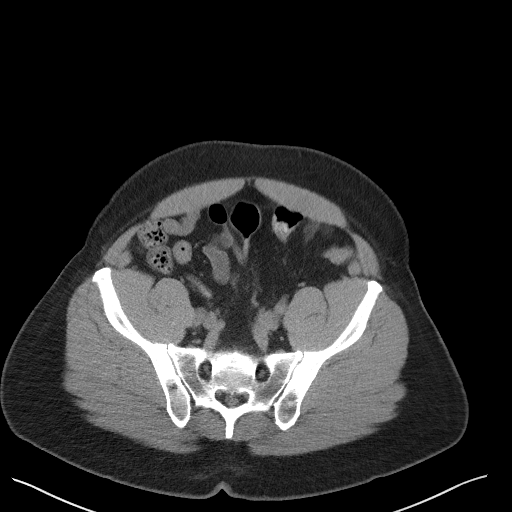
[im 36/87  soft-tissue]
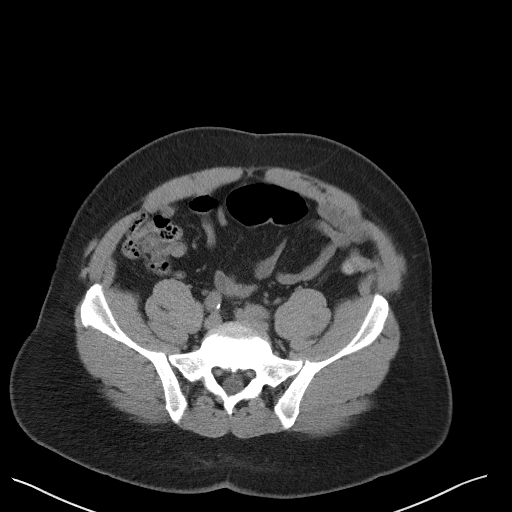
[im 44/87  soft-tissue]
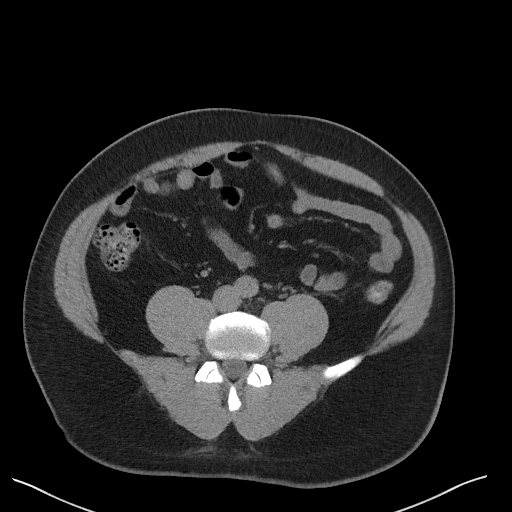
[im 51/87  soft-tissue]
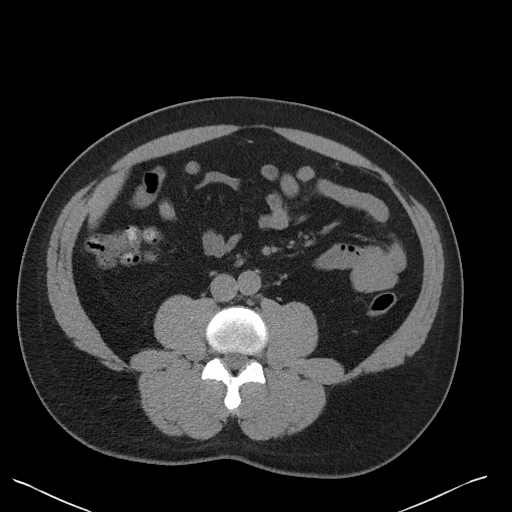
[im 55/87  soft-tissue]
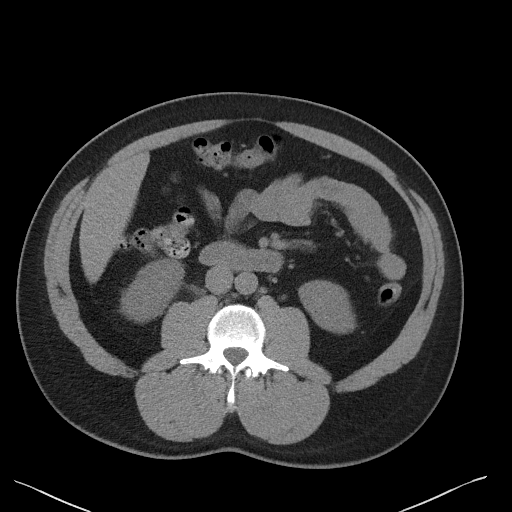
[im 55/87  bone]
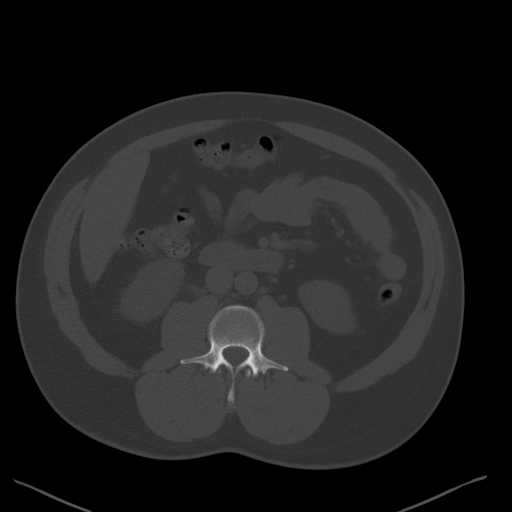
[im 63/87  soft-tissue]
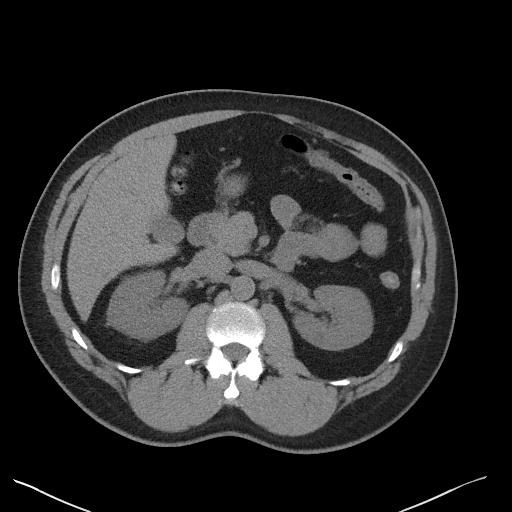
[im 67/87  soft-tissue]
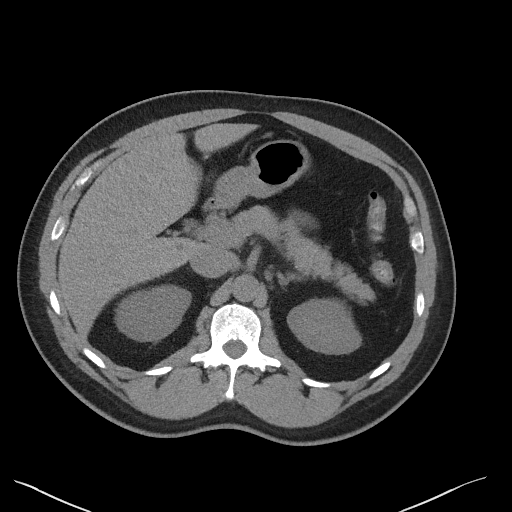
[im 75/87  soft-tissue]
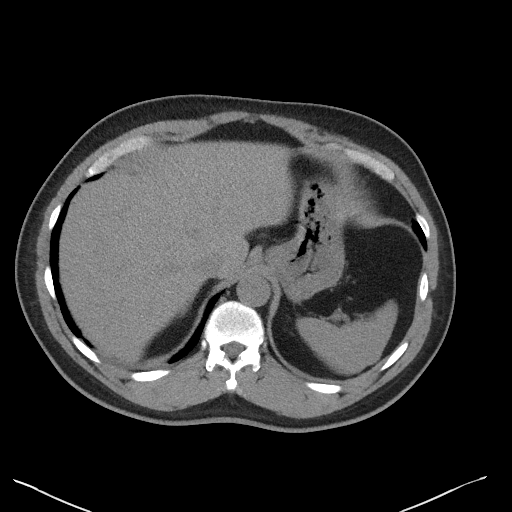
[im 83/87  soft-tissue]
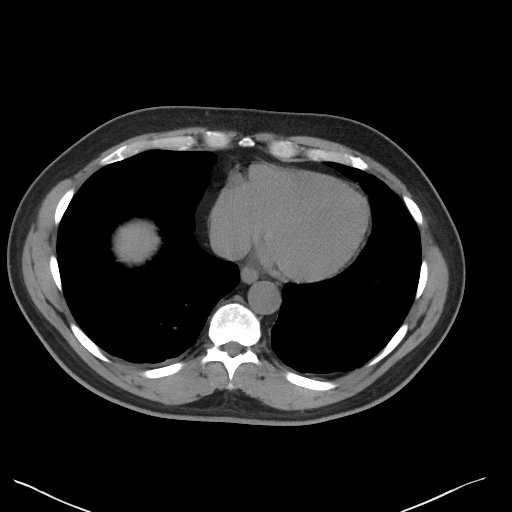

[Series 5: coronal st · coronal · 0.80mm/px · 3 of 95 slices shown]
[im 32/95  soft-tissue]
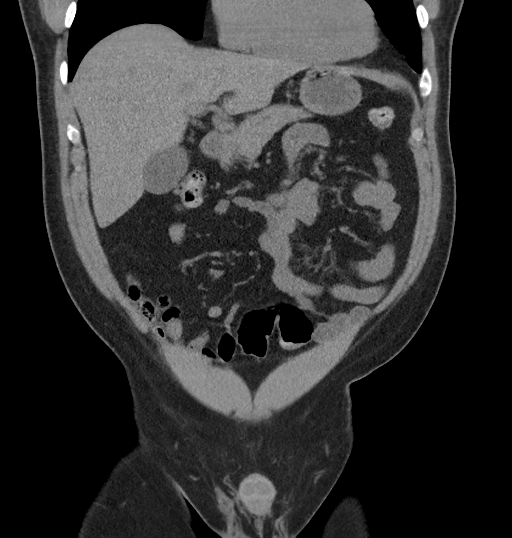
[im 42/95  soft-tissue]
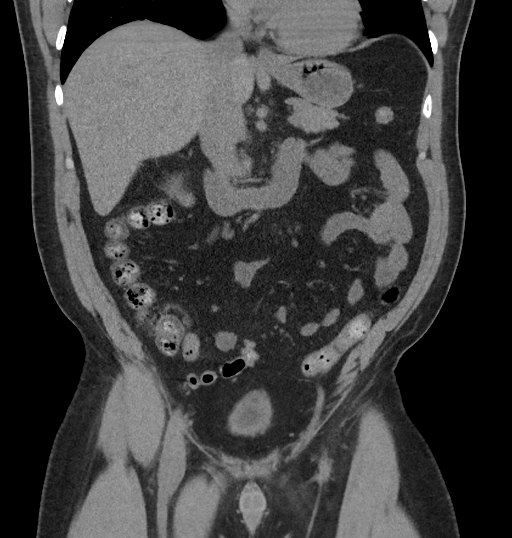
[im 53/95  soft-tissue]
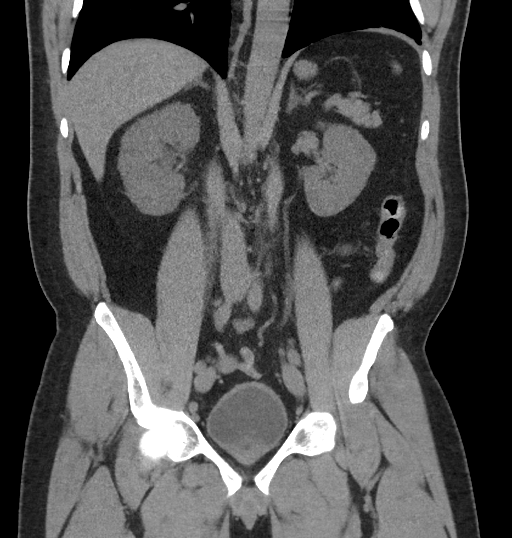

[16 of 46 positions shown; findings below may reference images not displayed]

FINDINGS: Lower chest: Trace dependent atelectasis.

Hepatobiliary: No suspicious focal abnormality within the liver
parenchyma. There is no evidence for gallstones, gallbladder wall
thickening, or pericholecystic fluid. No intrahepatic or
extrahepatic biliary dilation.

Pancreas: No focal mass lesion. No dilatation of the main duct. No
intraparenchymal cyst. No peripancreatic edema.

Spleen: No splenomegaly. No focal mass lesion.

Adrenals/Urinary Tract: No adrenal nodule or mass. Left kidney and
ureter unremarkable. A 6 x 10 x 7 mm stone is identified in the low
right renal pelvis just proximal to the UPJ. Since the study from 3
days ago, there is new mild right hydronephrosis with right
perinephric edema. Right ureter unremarkable. The urinary bladder
appears normal for the degree of distention.

Stomach/Bowel: Stomach is unremarkable. No gastric wall thickening.
No evidence of outlet obstruction. Duodenum is normally positioned
as is the ligament of Treitz. No small bowel wall thickening. No
small bowel dilatation. The terminal ileum is normal. The appendix
is normal. No gross colonic mass. No colonic wall thickening.

Vascular/Lymphatic: No abdominal aortic aneurysm. No abdominal
aortic atherosclerotic calcification. There is no gastrohepatic or
hepatoduodenal ligament lymphadenopathy. No retroperitoneal or
mesenteric lymphadenopathy. No pelvic sidewall lymphadenopathy.

Reproductive: The prostate gland and seminal vesicles are
unremarkable.

Other: No intraperitoneal free fluid.

Musculoskeletal: No worrisome lytic or sclerotic osseous
abnormality. Small sclerotic lesion in the right pubic bone again
noted, likely benign.
IMPRESSION: 6 x 10 x 7 mm stone identified in the low right renal pelvis just
proximal to the UPJ. Mild right hydronephrosis and right perinephric
edema is new since the exam from 3 days ago.
